# Patient Record
Sex: Female | Born: 1935
Health system: Southern US, Community
[De-identification: ages and names within clinical notes are randomized; demographics above are authoritative.]

---

## 2012-02-18 ENCOUNTER — Encounter: Payer: Self-pay | Admitting: Internal Medicine

## 2012-02-22 LAB — TSH: Thyroid Stimulating Horm: 6.54 u[IU]/mL — ABNORMAL HIGH

## 2012-02-23 ENCOUNTER — Encounter: Payer: Self-pay | Admitting: Internal Medicine

## 2012-03-25 ENCOUNTER — Encounter: Payer: Self-pay | Admitting: Internal Medicine

## 2012-04-24 ENCOUNTER — Encounter: Payer: Self-pay | Admitting: Internal Medicine

## 2012-05-19 LAB — URINALYSIS, COMPLETE
Bilirubin,UR: NEGATIVE
Nitrite: NEGATIVE
Ph: 6 (ref 4.5–8.0)
Protein: NEGATIVE
RBC,UR: 1 /HPF (ref 0–5)
Specific Gravity: 1.01 (ref 1.003–1.030)
Squamous Epithelial: 2

## 2012-05-20 LAB — URINE CULTURE

## 2012-05-25 ENCOUNTER — Encounter: Payer: Self-pay | Admitting: Internal Medicine

## 2012-05-25 LAB — BASIC METABOLIC PANEL
Anion Gap: 12 (ref 7–16)
Calcium, Total: 8.9 mg/dL (ref 8.5–10.1)
Chloride: 102 mmol/L (ref 98–107)
Co2: 26 mmol/L (ref 21–32)
EGFR (Non-African Amer.): >60
Osmolality: 278 (ref 275–301)

## 2012-06-25 ENCOUNTER — Encounter: Payer: Self-pay | Admitting: Internal Medicine

## 2012-07-25 ENCOUNTER — Encounter: Payer: Self-pay | Admitting: Internal Medicine

## 2012-08-25 ENCOUNTER — Encounter: Payer: Self-pay | Admitting: Internal Medicine

## 2012-09-24 ENCOUNTER — Encounter: Payer: Self-pay | Admitting: Internal Medicine

## 2012-10-02 ENCOUNTER — Ambulatory Visit: Payer: Self-pay | Admitting: Internal Medicine

## 2012-10-25 ENCOUNTER — Encounter: Payer: Self-pay | Admitting: Internal Medicine

## 2012-11-07 LAB — URINALYSIS, COMPLETE
Bilirubin,UR: NEGATIVE
Blood: NEGATIVE
Glucose,UR: NEGATIVE mg/dL (ref 0–75)
Hyaline Cast: 1
Ketone: NEGATIVE
Nitrite: NEGATIVE
Ph: 6 (ref 4.5–8.0)
RBC,UR: <1 /HPF (ref 0–5)
Specific Gravity: 1.008 (ref 1.003–1.030)
WBC UR: 6 /HPF (ref 0–5)

## 2012-11-07 LAB — BASIC METABOLIC PANEL
Anion Gap: 6 — ABNORMAL LOW (ref 7–16)
EGFR (African American): >60
EGFR (Non-African Amer.): >60
Sodium: 135 mmol/L — ABNORMAL LOW (ref 136–145)

## 2012-11-07 LAB — HEMOGLOBIN A1C: Hemoglobin A1C: 5.8 % (ref 4.2–6.3)

## 2012-11-07 LAB — TSH: Thyroid Stimulating Horm: 3.21 u[IU]/mL

## 2012-11-08 LAB — URINE CULTURE

## 2012-11-09 LAB — CBC WITH DIFFERENTIAL/PLATELET
Basophil %: 0.4 %
Eosinophil #: 0.6 10*3/uL (ref 0.0–0.7)
HCT: 34.6 % — ABNORMAL LOW (ref 35.0–47.0)
HGB: 11.4 g/dL — ABNORMAL LOW (ref 12.0–16.0)
Lymphocyte #: 1.3 10*3/uL (ref 1.0–3.6)
Lymphocyte %: 18.7 %
MCH: 29.2 pg (ref 26.0–34.0)
MCHC: 33 g/dL (ref 32.0–36.0)
MCV: 89 fL (ref 80–100)
Monocyte #: 0.6 x10 3/mm (ref 0.2–0.9)
Neutrophil %: 64.4 %
WBC: 7.2 10*3/uL (ref 3.6–11.0)

## 2012-11-25 ENCOUNTER — Encounter: Payer: Self-pay | Admitting: Internal Medicine

## 2012-12-04 LAB — URINALYSIS, COMPLETE
Blood: NEGATIVE
Glucose,UR: NEGATIVE mg/dL (ref 0–75)
Granular Cast: 4
Ketone: NEGATIVE
Nitrite: NEGATIVE
Squamous Epithelial: 3
WBC UR: 3 /HPF (ref 0–5)

## 2012-12-05 LAB — URINE CULTURE

## 2012-12-23 ENCOUNTER — Encounter: Payer: Self-pay | Admitting: Internal Medicine

## 2013-01-27 LAB — URINALYSIS, COMPLETE
Bacteria: NONE SEEN
Blood: NEGATIVE
Glucose,UR: NEGATIVE mg/dL (ref 0–75)
Nitrite: NEGATIVE
Ph: 6 (ref 4.5–8.0)
RBC,UR: 2 /HPF (ref 0–5)
Specific Gravity: 1.014 (ref 1.003–1.030)
Squamous Epithelial: NONE SEEN
WBC UR: 1 /HPF (ref 0–5)

## 2013-01-29 LAB — COMPREHENSIVE METABOLIC PANEL
Albumin: 3.6 g/dL (ref 3.4–5.0)
Alkaline Phosphatase: 166 U/L — ABNORMAL HIGH (ref 50–136)
Anion Gap: 7 (ref 7–16)
BUN: 9 mg/dL (ref 7–18)
Bilirubin,Total: 0.7 mg/dL (ref 0.2–1.0)
Calcium, Total: 9.1 mg/dL (ref 8.5–10.1)
Co2: 29 mmol/L (ref 21–32)
Creatinine: 0.53 mg/dL — ABNORMAL LOW (ref 0.60–1.30)
EGFR (Non-African Amer.): >60
Glucose: 125 mg/dL — ABNORMAL HIGH (ref 65–99)
Osmolality: 267 (ref 275–301)
SGPT (ALT): 13 U/L (ref 12–78)
Total Protein: 8.1 g/dL (ref 6.4–8.2)

## 2013-01-29 LAB — CBC WITH DIFFERENTIAL/PLATELET
Basophil %: 0.3 %
Eosinophil #: 0.3 10*3/uL (ref 0.0–0.7)
HGB: 13.2 g/dL (ref 12.0–16.0)
Lymphocyte #: 1.7 10*3/uL (ref 1.0–3.6)
Lymphocyte %: 15.5 %
MCH: 29.4 pg (ref 26.0–34.0)
Monocyte #: 0.8 x10 3/mm (ref 0.2–0.9)
Monocyte %: 7.3 %
Neutrophil #: 8.2 10*3/uL — ABNORMAL HIGH (ref 1.4–6.5)
Neutrophil %: 74 %
RBC: 4.49 10*6/uL (ref 3.80–5.20)
RDW: 16.2 % — ABNORMAL HIGH (ref 11.5–14.5)

## 2013-01-30 ENCOUNTER — Inpatient Hospital Stay: Payer: Self-pay | Admitting: Surgery

## 2013-01-30 DIAGNOSIS — Z01810 Encounter for preprocedural cardiovascular examination: Secondary | ICD-10-CM

## 2013-01-30 LAB — PROTIME-INR
INR: 1.2
Prothrombin Time: 15.5 secs — ABNORMAL HIGH (ref 11.5–14.7)

## 2013-01-30 LAB — APTT: Activated PTT: 40.4 secs — ABNORMAL HIGH (ref 23.6–35.9)

## 2013-01-31 LAB — CBC WITH DIFFERENTIAL/PLATELET
Basophil #: 0 10*3/uL (ref 0.0–0.1)
Eosinophil #: 0 10*3/uL (ref 0.0–0.7)
Eosinophil %: 0 %
Lymphocyte #: 0.8 10*3/uL — ABNORMAL LOW (ref 1.0–3.6)
Lymphocyte %: 5.7 %
MCH: 28.9 pg (ref 26.0–34.0)
MCHC: 32.6 g/dL (ref 32.0–36.0)
MCV: 89 fL (ref 80–100)
Neutrophil #: 12.3 10*3/uL — ABNORMAL HIGH (ref 1.4–6.5)
Platelet: 231 10*3/uL (ref 150–440)
RBC: 3.93 10*6/uL (ref 3.80–5.20)
WBC: 14.3 10*3/uL — ABNORMAL HIGH (ref 3.6–11.0)

## 2013-01-31 LAB — COMPREHENSIVE METABOLIC PANEL
Alkaline Phosphatase: 123 U/L (ref 50–136)
Anion Gap: 7 (ref 7–16)
BUN: 8 mg/dL (ref 7–18)
Bilirubin,Total: 0.6 mg/dL (ref 0.2–1.0)
Calcium, Total: 8.7 mg/dL (ref 8.5–10.1)
Creatinine: 0.8 mg/dL (ref 0.60–1.30)
Osmolality: 273 (ref 275–301)
SGOT(AST): 31 U/L (ref 15–37)
SGPT (ALT): 20 U/L (ref 12–78)
Sodium: 136 mmol/L (ref 136–145)
Total Protein: 7.3 g/dL (ref 6.4–8.2)

## 2013-02-01 LAB — COMPREHENSIVE METABOLIC PANEL
Albumin: 3 g/dL — ABNORMAL LOW (ref 3.4–5.0)
Anion Gap: 4 — ABNORMAL LOW (ref 7–16)
BUN: 7 mg/dL (ref 7–18)
Chloride: 105 mmol/L (ref 98–107)
Co2: 28 mmol/L (ref 21–32)
EGFR (African American): >60
EGFR (Non-African Amer.): >60
Glucose: 113 mg/dL — ABNORMAL HIGH (ref 65–99)
Osmolality: 273 (ref 275–301)
Potassium: 4.3 mmol/L (ref 3.5–5.1)
SGOT(AST): 17 U/L (ref 15–37)

## 2013-02-01 LAB — PATHOLOGY REPORT

## 2013-02-02 LAB — CBC WITH DIFFERENTIAL/PLATELET
Basophil #: 0.2 10*3/uL — ABNORMAL HIGH (ref 0.0–0.1)
Basophil %: 1.1 %
Eosinophil #: 0.4 10*3/uL (ref 0.0–0.7)
HCT: 33.1 % — ABNORMAL LOW (ref 35.0–47.0)
Lymphocyte #: 1.4 10*3/uL (ref 1.0–3.6)
MCH: 29.1 pg (ref 26.0–34.0)
MCHC: 32.4 g/dL (ref 32.0–36.0)
MCV: 90 fL (ref 80–100)
Monocyte #: 1.1 x10 3/mm — ABNORMAL HIGH (ref 0.2–0.9)
Monocyte %: 7.9 %
Neutrophil %: 78.6 %
WBC: 14.5 10*3/uL — ABNORMAL HIGH (ref 3.6–11.0)

## 2013-02-03 LAB — BASIC METABOLIC PANEL
Anion Gap: 6 — ABNORMAL LOW (ref 7–16)
BUN: 5 mg/dL — ABNORMAL LOW (ref 7–18)
Creatinine: 0.6 mg/dL (ref 0.60–1.30)
EGFR (Non-African Amer.): >60
Glucose: 99 mg/dL (ref 65–99)
Osmolality: 269 (ref 275–301)
Potassium: 3.6 mmol/L (ref 3.5–5.1)
Sodium: 136 mmol/L (ref 136–145)

## 2013-02-04 LAB — CBC WITH DIFFERENTIAL/PLATELET
Basophil #: 0.1 10*3/uL (ref 0.0–0.1)
Basophil %: 0.6 %
Eosinophil #: 0.6 10*3/uL (ref 0.0–0.7)
Eosinophil %: 5.6 %
HGB: 11.2 g/dL — ABNORMAL LOW (ref 12.0–16.0)
Lymphocyte %: 13.3 %
MCH: 28.8 pg (ref 26.0–34.0)
MCV: 89 fL (ref 80–100)
Monocyte #: 1.1 x10 3/mm — ABNORMAL HIGH (ref 0.2–0.9)
Monocyte %: 10 %
Neutrophil #: 7.7 10*3/uL — ABNORMAL HIGH (ref 1.4–6.5)
Neutrophil %: 70.5 %
Platelet: 329 10*3/uL (ref 150–440)
RBC: 3.9 10*6/uL (ref 3.80–5.20)
RDW: 16.2 % — ABNORMAL HIGH (ref 11.5–14.5)

## 2013-02-04 LAB — BASIC METABOLIC PANEL
Anion Gap: 4 — ABNORMAL LOW (ref 7–16)
Co2: 29 mmol/L (ref 21–32)
Creatinine: 0.65 mg/dL (ref 0.60–1.30)
EGFR (Non-African Amer.): >60
Glucose: 102 mg/dL — ABNORMAL HIGH (ref 65–99)
Osmolality: 267 (ref 275–301)
Potassium: 3.9 mmol/L (ref 3.5–5.1)
Sodium: 134 mmol/L — ABNORMAL LOW (ref 136–145)

## 2013-02-05 LAB — CBC WITH DIFFERENTIAL/PLATELET
Basophil %: 1.1 %
Eosinophil #: 0.6 10*3/uL (ref 0.0–0.7)
Eosinophil %: 5.1 %
HCT: 32.8 % — ABNORMAL LOW (ref 35.0–47.0)
Lymphocyte %: 13.8 %
MCH: 29.7 pg (ref 26.0–34.0)
MCHC: 33.3 g/dL (ref 32.0–36.0)
Monocyte #: 1.1 x10 3/mm — ABNORMAL HIGH (ref 0.2–0.9)
Monocyte %: 9.6 %
Neutrophil #: 8.3 10*3/uL — ABNORMAL HIGH (ref 1.4–6.5)
Platelet: 331 10*3/uL (ref 150–440)
WBC: 11.8 10*3/uL — ABNORMAL HIGH (ref 3.6–11.0)

## 2013-02-05 LAB — COMPREHENSIVE METABOLIC PANEL
Albumin: 2.6 g/dL — ABNORMAL LOW (ref 3.4–5.0)
Alkaline Phosphatase: 104 U/L (ref 50–136)
Anion Gap: 5 — ABNORMAL LOW (ref 7–16)
BUN: 10 mg/dL (ref 7–18)
Calcium, Total: 9.4 mg/dL (ref 8.5–10.1)
Chloride: 102 mmol/L (ref 98–107)
Co2: 28 mmol/L (ref 21–32)
Creatinine: 0.72 mg/dL (ref 0.60–1.30)
EGFR (African American): >60
Osmolality: 269 (ref 275–301)
Potassium: 3.6 mmol/L (ref 3.5–5.1)
Sodium: 135 mmol/L — ABNORMAL LOW (ref 136–145)
Total Protein: 7.1 g/dL (ref 6.4–8.2)

## 2013-02-06 LAB — HEPATIC FUNCTION PANEL A (ARMC)
Bilirubin, Direct: 0.1 mg/dL (ref 0.00–0.20)
Bilirubin,Total: 0.3 mg/dL (ref 0.2–1.0)
Total Protein: 7.2 g/dL (ref 6.4–8.2)

## 2013-02-08 ENCOUNTER — Encounter: Payer: Self-pay | Admitting: Internal Medicine

## 2013-02-22 ENCOUNTER — Encounter: Payer: Self-pay | Admitting: Internal Medicine

## 2013-03-03 ENCOUNTER — Inpatient Hospital Stay: Payer: Self-pay | Admitting: Internal Medicine

## 2013-03-03 LAB — COMPREHENSIVE METABOLIC PANEL
Alkaline Phosphatase: 281 U/L — ABNORMAL HIGH (ref 50–136)
Anion Gap: 5 — ABNORMAL LOW (ref 7–16)
BUN: 16 mg/dL (ref 7–18)
Chloride: 102 mmol/L (ref 98–107)
Co2: 29 mmol/L (ref 21–32)
Creatinine: 1.05 mg/dL (ref 0.60–1.30)
Glucose: 112 mg/dL — ABNORMAL HIGH (ref 65–99)
Osmolality: 274 (ref 275–301)
Potassium: 4.5 mmol/L (ref 3.5–5.1)
SGPT (ALT): 61 U/L (ref 12–78)
Sodium: 136 mmol/L (ref 136–145)
Total Protein: 8.4 g/dL — ABNORMAL HIGH (ref 6.4–8.2)

## 2013-03-03 LAB — DRUG SCREEN, URINE
Barbiturates, Ur Screen: NEGATIVE (ref ?–200)
Benzodiazepine, Ur Scrn: NEGATIVE (ref ?–200)
Cannabinoid 50 Ng, Ur ~~LOC~~: NEGATIVE (ref ?–50)
Phencyclidine (PCP) Ur S: NEGATIVE (ref ?–25)
Tricyclic, Ur Screen: NEGATIVE (ref ?–1000)

## 2013-03-03 LAB — CBC
HCT: 37.9 % (ref 35.0–47.0)
HGB: 12.3 g/dL (ref 12.0–16.0)
Platelet: 274 10*3/uL (ref 150–440)
RBC: 4.32 10*6/uL (ref 3.80–5.20)
WBC: 12.7 10*3/uL — ABNORMAL HIGH (ref 3.6–11.0)

## 2013-03-03 LAB — URINALYSIS, COMPLETE
Bilirubin,UR: NEGATIVE
Blood: NEGATIVE
Ketone: NEGATIVE
Ph: 5 (ref 4.5–8.0)
RBC,UR: 5 /HPF (ref 0–5)
Specific Gravity: 1.013 (ref 1.003–1.030)

## 2013-03-04 LAB — CBC WITH DIFFERENTIAL/PLATELET
Basophil #: 0 10*3/uL (ref 0.0–0.1)
Eosinophil #: 0.2 10*3/uL (ref 0.0–0.7)
HCT: 35.1 % (ref 35.0–47.0)
Lymphocyte %: 10.2 %
MCV: 88 fL (ref 80–100)
Monocyte #: 1.1 x10 3/mm — ABNORMAL HIGH (ref 0.2–0.9)
Monocyte %: 9 %
Neutrophil #: 9.9 10*3/uL — ABNORMAL HIGH (ref 1.4–6.5)
Neutrophil %: 78.7 %
Platelet: 272 10*3/uL (ref 150–440)
RDW: 16.1 % — ABNORMAL HIGH (ref 11.5–14.5)
WBC: 12.6 10*3/uL — ABNORMAL HIGH (ref 3.6–11.0)

## 2013-03-04 LAB — BASIC METABOLIC PANEL
Anion Gap: 3 — ABNORMAL LOW (ref 7–16)
Co2: 32 mmol/L (ref 21–32)
Creatinine: 0.81 mg/dL (ref 0.60–1.30)
EGFR (African American): >60
Potassium: 4.4 mmol/L (ref 3.5–5.1)
Sodium: 138 mmol/L (ref 136–145)

## 2013-03-05 LAB — COMPREHENSIVE METABOLIC PANEL
Albumin: 2.7 g/dL — ABNORMAL LOW (ref 3.4–5.0)
BUN: 12 mg/dL (ref 7–18)
Bilirubin,Total: 0.8 mg/dL (ref 0.2–1.0)
Chloride: 100 mmol/L (ref 98–107)
Co2: 32 mmol/L (ref 21–32)
Creatinine: 0.76 mg/dL (ref 0.60–1.30)
EGFR (African American): >60
Glucose: 84 mg/dL (ref 65–99)
Osmolality: 267 (ref 275–301)
SGOT(AST): 44 U/L — ABNORMAL HIGH (ref 15–37)

## 2013-03-05 LAB — CBC WITH DIFFERENTIAL/PLATELET
Basophil %: 0.2 %
Eosinophil #: 0.4 10*3/uL (ref 0.0–0.7)
HCT: 31.9 % — ABNORMAL LOW (ref 35.0–47.0)
HGB: 10.9 g/dL — ABNORMAL LOW (ref 12.0–16.0)
Lymphocyte #: 1.8 10*3/uL (ref 1.0–3.6)
MCH: 29.9 pg (ref 26.0–34.0)
MCHC: 34.2 g/dL (ref 32.0–36.0)
Monocyte #: 1 x10 3/mm — ABNORMAL HIGH (ref 0.2–0.9)
Neutrophil #: 4.8 10*3/uL (ref 1.4–6.5)
Neutrophil %: 60.5 %
RBC: 3.65 10*6/uL — ABNORMAL LOW (ref 3.80–5.20)
RDW: 15.8 % — ABNORMAL HIGH (ref 11.5–14.5)
WBC: 8 10*3/uL (ref 3.6–11.0)

## 2013-03-11 LAB — URINALYSIS, COMPLETE
Bilirubin,UR: NEGATIVE
Blood: NEGATIVE
Glucose,UR: NEGATIVE mg/dL (ref 0–75)
Ketone: NEGATIVE
Nitrite: NEGATIVE
Ph: 6 (ref 4.5–8.0)
Protein: NEGATIVE
RBC,UR: 7 /HPF (ref 0–5)

## 2013-03-13 LAB — URINE CULTURE

## 2013-03-25 ENCOUNTER — Encounter: Payer: Self-pay | Admitting: Internal Medicine

## 2013-04-19 LAB — URINALYSIS, COMPLETE
Bacteria: NONE SEEN
Bilirubin,UR: NEGATIVE
Blood: NEGATIVE
Ketone: NEGATIVE
Ph: 5 (ref 4.5–8.0)
Protein: NEGATIVE
RBC,UR: 2 /HPF (ref 0–5)
Specific Gravity: 1.016 (ref 1.003–1.030)
Squamous Epithelial: 3
WBC UR: 11 /HPF (ref 0–5)

## 2013-04-20 LAB — URINE CULTURE

## 2013-04-24 ENCOUNTER — Encounter: Payer: Self-pay | Admitting: Internal Medicine

## 2013-04-26 LAB — COMPREHENSIVE METABOLIC PANEL
Albumin: 3.5 g/dL (ref 3.4–5.0)
Alkaline Phosphatase: 183 U/L — ABNORMAL HIGH (ref 50–136)
Anion Gap: 8 (ref 7–16)
Bilirubin,Total: 0.3 mg/dL (ref 0.2–1.0)
Calcium, Total: 9.9 mg/dL (ref 8.5–10.1)
Chloride: 102 mmol/L (ref 98–107)
Co2: 29 mmol/L (ref 21–32)
Creatinine: 0.73 mg/dL (ref 0.60–1.30)
EGFR (African American): >60
Glucose: 91 mg/dL (ref 65–99)
Osmolality: 278 (ref 275–301)
SGOT(AST): 14 U/L — ABNORMAL LOW (ref 15–37)
SGPT (ALT): 15 U/L (ref 12–78)
Total Protein: 8.2 g/dL (ref 6.4–8.2)

## 2013-04-26 LAB — TSH: Thyroid Stimulating Horm: 2.1 u[IU]/mL

## 2013-04-26 LAB — CBC WITH DIFFERENTIAL/PLATELET
Basophil %: 0.4 %
Eosinophil %: 3.4 %
HCT: 39 % (ref 35.0–47.0)
HGB: 13.2 g/dL (ref 12.0–16.0)
Lymphocyte %: 26 %
MCH: 29.9 pg (ref 26.0–34.0)
Monocyte %: 8.4 %
Neutrophil %: 61.8 %
Platelet: 258 10*3/uL (ref 150–440)
RBC: 4.42 10*6/uL (ref 3.80–5.20)
RDW: 16.6 % — ABNORMAL HIGH (ref 11.5–14.5)

## 2013-05-25 ENCOUNTER — Encounter: Payer: Self-pay | Admitting: Internal Medicine

## 2013-06-25 ENCOUNTER — Encounter: Payer: Self-pay | Admitting: Internal Medicine

## 2013-07-25 ENCOUNTER — Encounter: Payer: Self-pay | Admitting: Internal Medicine

## 2013-08-25 ENCOUNTER — Encounter: Payer: Self-pay | Admitting: Internal Medicine

## 2013-09-19 LAB — URINALYSIS, COMPLETE
Bilirubin,UR: NEGATIVE
Glucose,UR: NEGATIVE mg/dL (ref 0–75)
Ketone: NEGATIVE
Leukocyte Esterase: NEGATIVE
Nitrite: NEGATIVE
Ph: 5 (ref 4.5–8.0)
Protein: NEGATIVE
Specific Gravity: 1.013 (ref 1.003–1.030)
Squamous Epithelial: 2
WBC UR: 3 /HPF (ref 0–5)

## 2013-09-21 LAB — URINE CULTURE

## 2013-09-24 ENCOUNTER — Encounter: Payer: Self-pay | Admitting: Internal Medicine

## 2013-09-29 IMAGING — CT CT HEAD WITHOUT CONTRAST
1 of 2 series · 13 of 30 positions shown, 17 images · non-contrast
Comparison: none

REASON FOR EXAM: altered mental status
COMMENTS:   May transport without cardiac monitor

PROCEDURE:     CT  - CT HEAD WITHOUT CONTRAST  - March 03, 2013  [DATE]
RESULT:     Technique: Helical noncontrasted 5 mm sections were obtained
from the skull base through the vertex.

[Series 4: soft tissue recon · axial · 0.42mm/px · z∈[-150,-21]mm · 13 of 32 slices shown, 17 images]
[im 3/32  brain]
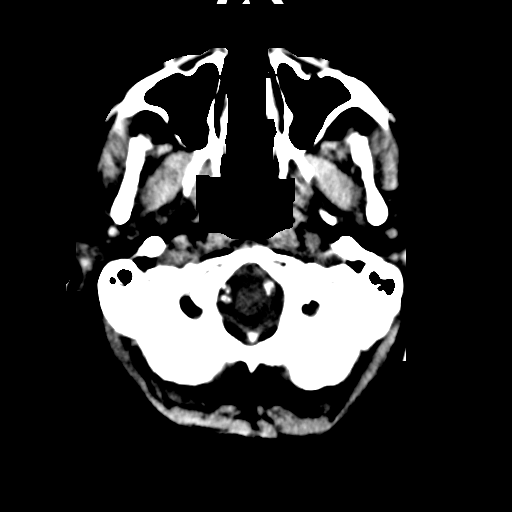
[im 3/32  bone]
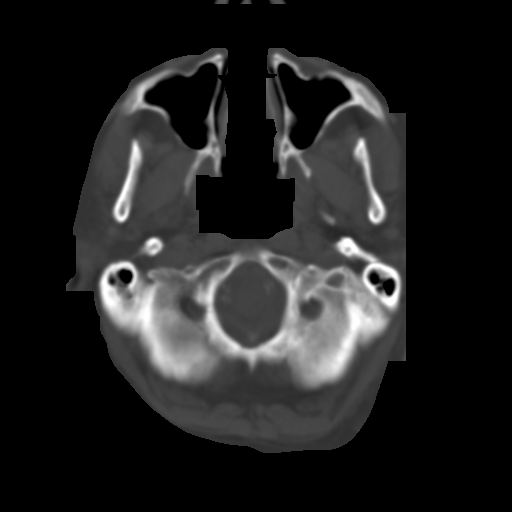
[im 5/32  brain]
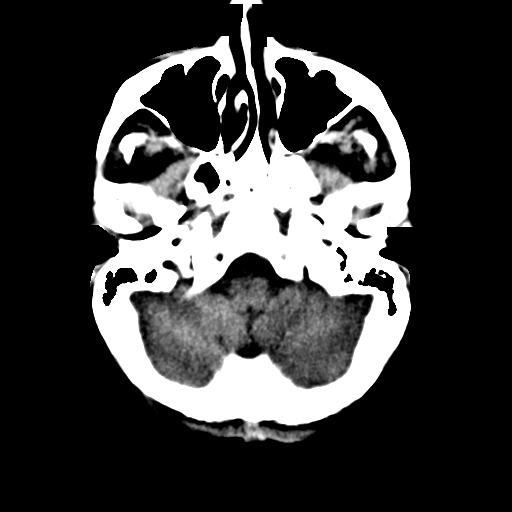
[im 7/32  brain]
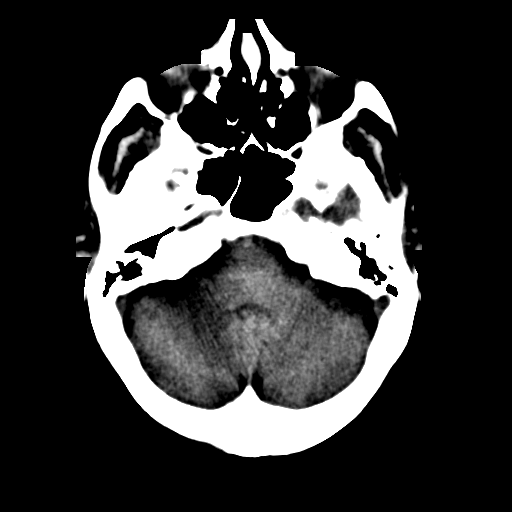
[im 9/32  brain]
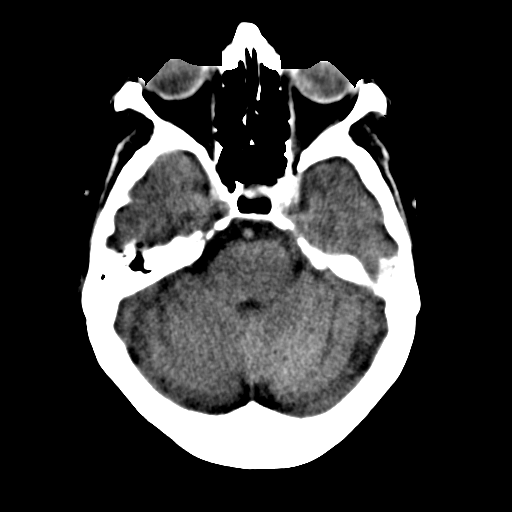
[im 12/32  brain]
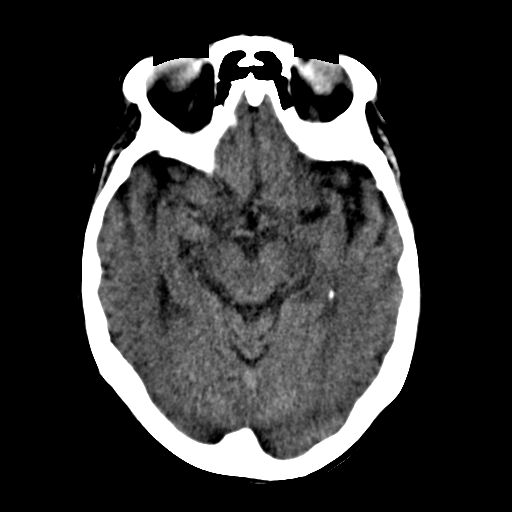
[im 12/32  bone]
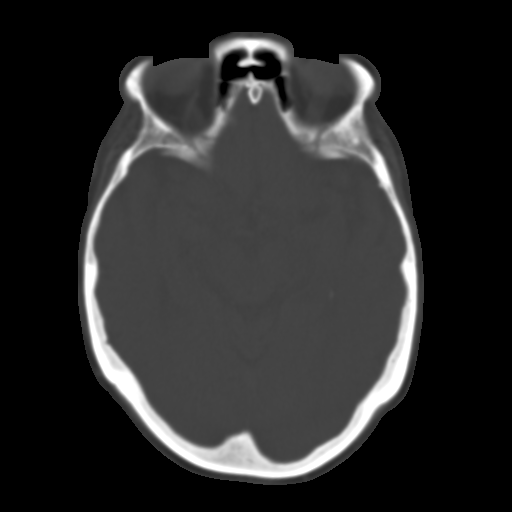
[im 14/32  brain]
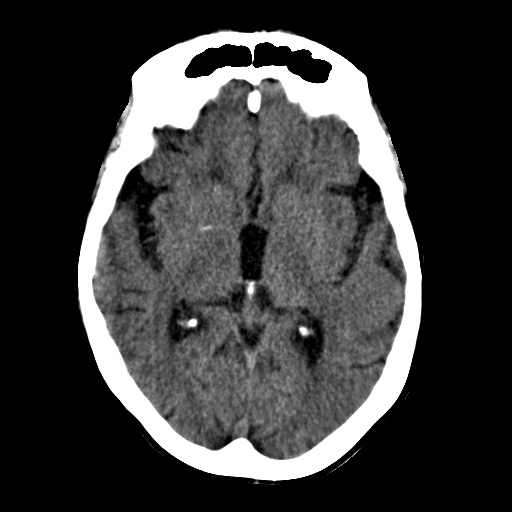
[im 16/32  brain]
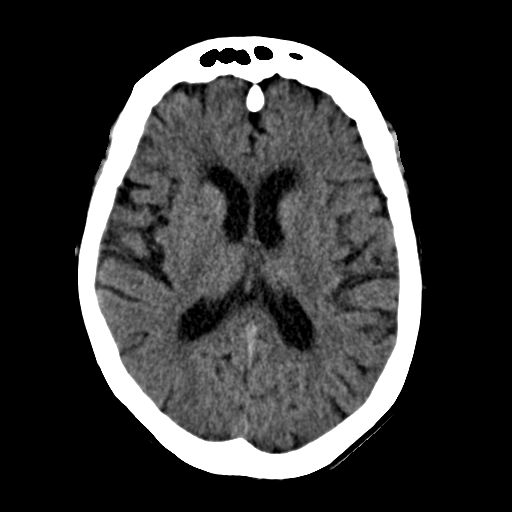
[im 18/32  brain]
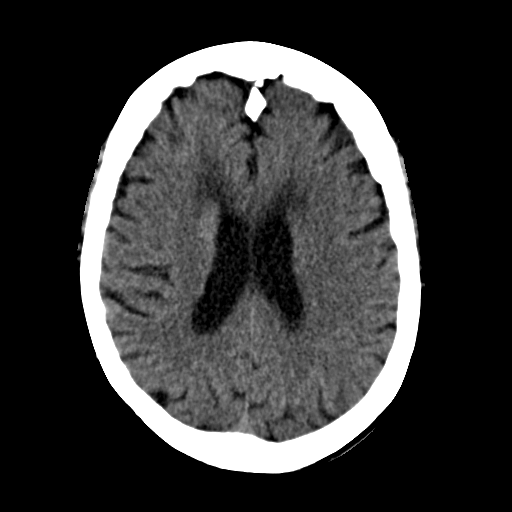
[im 20/32  brain]
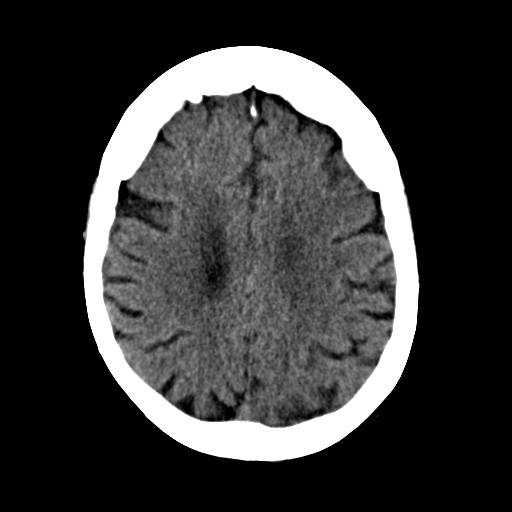
[im 20/32  bone]
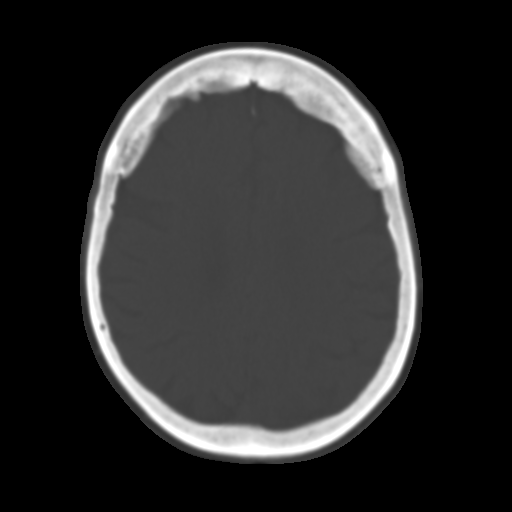
[im 23/32  brain]
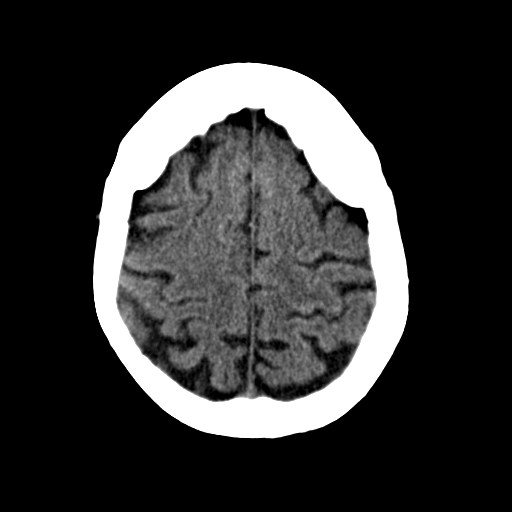
[im 25/32  brain]
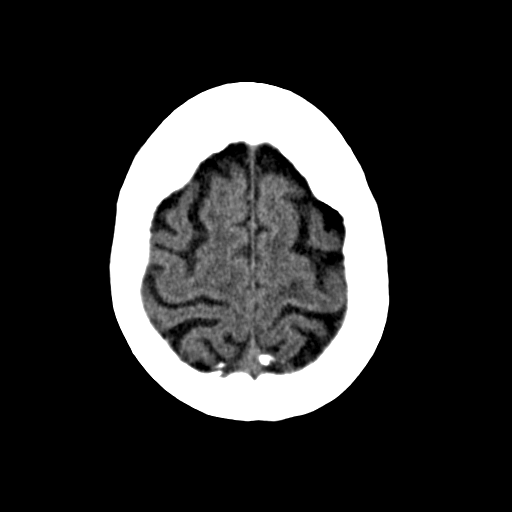
[im 27/32  brain]
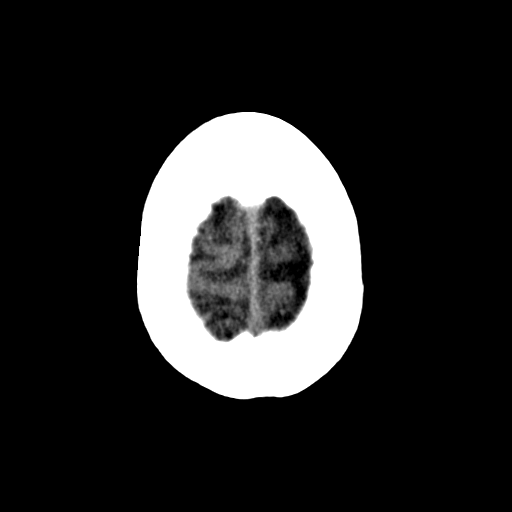
[im 29/32  brain]
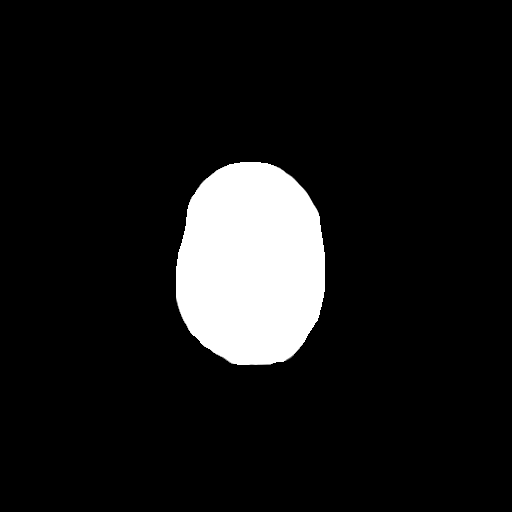
[im 29/32  bone]
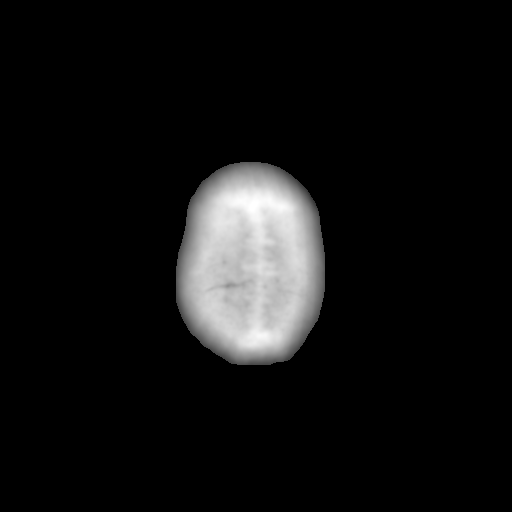

[13 of 30 positions shown; findings below may reference images not displayed]

FINDINGS: Diffuse cortical and cerebellar atrophy is identified as well as
diffuse areas of low attenuation within the subcortical, deep and
periventricular white matter regions. There is not evidence of intra-axial
nor extra-axial fluid collections, acute hemorrhage, mass effect, nor a
depressed skull fracture. The visualized paranasal sinuses and mastoid air
cells are patent.
IMPRESSION: Chronic and involutional changes without evidence of acute
abnormalities. If there is persistent clinical concern further evaluation
with MRI is recommended.
2. Dr. Baadjies of the emergency department was informed of these findings
via a preliminary faxed report.

## 2013-10-15 ENCOUNTER — Emergency Department: Payer: Self-pay | Admitting: Emergency Medicine

## 2013-10-25 ENCOUNTER — Ambulatory Visit: Payer: Self-pay | Admitting: Nurse Practitioner

## 2013-10-25 ENCOUNTER — Encounter: Payer: Self-pay | Admitting: Internal Medicine

## 2013-10-27 LAB — URINALYSIS, COMPLETE
BLOOD: NEGATIVE
Bilirubin,UR: NEGATIVE
Glucose,UR: NEGATIVE mg/dL (ref 0–75)
KETONE: NEGATIVE
NITRITE: NEGATIVE
PH: 5 (ref 4.5–8.0)
PROTEIN: NEGATIVE
RBC,UR: <1 /HPF (ref 0–5)
Specific Gravity: 1.023 (ref 1.003–1.030)
Squamous Epithelial: 4
WBC UR: 3 /HPF (ref 0–5)

## 2013-10-28 LAB — URINE CULTURE

## 2013-11-08 LAB — HEMOGLOBIN A1C: Hemoglobin A1C: 6.2 % (ref 4.2–6.3)

## 2013-11-08 LAB — LIPID PANEL
CHOLESTEROL: 198 mg/dL (ref 0–200)
HDL: 29 mg/dL — AB (ref 40–60)
Ldl Cholesterol, Calc: 139 mg/dL — ABNORMAL HIGH (ref 0–100)
Triglycerides: 151 mg/dL (ref 0–200)
VLDL Cholesterol, Calc: 30 mg/dL (ref 5–40)

## 2013-11-13 LAB — BASIC METABOLIC PANEL
Anion Gap: 7 (ref 7–16)
BUN: 9 mg/dL (ref 7–18)
CALCIUM: 9.7 mg/dL (ref 8.5–10.1)
CO2: 30 mmol/L (ref 21–32)
CREATININE: 0.76 mg/dL (ref 0.60–1.30)
Chloride: 103 mmol/L (ref 98–107)
Glucose: 107 mg/dL — ABNORMAL HIGH (ref 65–99)
Osmolality: 279 (ref 275–301)
Potassium: 3.9 mmol/L (ref 3.5–5.1)
Sodium: 140 mmol/L (ref 136–145)

## 2013-11-13 LAB — TSH: Thyroid Stimulating Horm: 1.5 u[IU]/mL

## 2013-11-13 LAB — CBC WITH DIFFERENTIAL/PLATELET
Basophil #: 0 10*3/uL (ref 0.0–0.1)
Basophil %: 0.5 %
EOS PCT: 5 %
Eosinophil #: 0.4 10*3/uL (ref 0.0–0.7)
HCT: 42.8 % (ref 35.0–47.0)
HGB: 14.1 g/dL (ref 12.0–16.0)
LYMPHS PCT: 28.8 %
Lymphocyte #: 2.3 10*3/uL (ref 1.0–3.6)
MCH: 30.8 pg (ref 26.0–34.0)
MCHC: 33.1 g/dL (ref 32.0–36.0)
MCV: 93 fL (ref 80–100)
MONOS PCT: 7.6 %
Monocyte #: 0.6 x10 3/mm (ref 0.2–0.9)
Neutrophil #: 4.7 10*3/uL (ref 1.4–6.5)
Neutrophil %: 58.1 %
PLATELETS: 216 10*3/uL (ref 150–440)
RBC: 4.6 10*6/uL (ref 3.80–5.20)
RDW: 15 % — AB (ref 11.5–14.5)
WBC: 8.1 10*3/uL (ref 3.6–11.0)

## 2013-11-13 LAB — FOLATE: FOLIC ACID: 9 ng/mL (ref 3.1–100.0)

## 2013-11-14 LAB — URINALYSIS, COMPLETE
BILIRUBIN, UR: NEGATIVE
Blood: NEGATIVE
GLUCOSE, UR: NEGATIVE mg/dL (ref 0–75)
Ketone: NEGATIVE
Nitrite: NEGATIVE
PH: 5 (ref 4.5–8.0)
PROTEIN: NEGATIVE
RBC,UR: NONE SEEN /HPF (ref 0–5)
SPECIFIC GRAVITY: 1.006 (ref 1.003–1.030)
Squamous Epithelial: 4
WBC UR: 6 /HPF (ref 0–5)

## 2013-11-15 LAB — RAPID INFLUENZA A&B ANTIGENS (ARMC ONLY)

## 2013-11-16 LAB — URINE CULTURE

## 2013-11-25 ENCOUNTER — Encounter: Payer: Self-pay | Admitting: Internal Medicine

## 2013-12-23 ENCOUNTER — Other Ambulatory Visit: Payer: Self-pay | Admitting: Internal Medicine

## 2013-12-23 LAB — URINALYSIS, COMPLETE
BACTERIA: NONE SEEN
BLOOD: NEGATIVE
Bilirubin,UR: NEGATIVE
Glucose,UR: NEGATIVE mg/dL (ref 0–75)
KETONE: NEGATIVE
LEUKOCYTE ESTERASE: NEGATIVE
NITRITE: NEGATIVE
PH: 5 (ref 4.5–8.0)
PROTEIN: NEGATIVE
Specific Gravity: 1.026 (ref 1.003–1.030)
Squamous Epithelial: 5
WBC UR: 4 /HPF (ref 0–5)

## 2013-12-24 LAB — URINE CULTURE

## 2013-12-31 ENCOUNTER — Ambulatory Visit: Payer: Self-pay | Admitting: Gerontology

## 2013-12-31 ENCOUNTER — Encounter: Payer: Self-pay | Admitting: Internal Medicine

## 2013-12-31 LAB — CBC WITH DIFFERENTIAL/PLATELET
BASOS PCT: 0.2 %
Basophil #: 0 10*3/uL (ref 0.0–0.1)
EOS PCT: 0.1 %
Eosinophil #: 0 10*3/uL (ref 0.0–0.7)
HCT: 43.4 % (ref 35.0–47.0)
HGB: 14.7 g/dL (ref 12.0–16.0)
LYMPHS ABS: 0.7 10*3/uL — AB (ref 1.0–3.6)
Lymphocyte %: 3.5 %
MCH: 32.1 pg (ref 26.0–34.0)
MCHC: 33.9 g/dL (ref 32.0–36.0)
MCV: 95 fL (ref 80–100)
Monocyte #: 1.3 x10 3/mm — ABNORMAL HIGH (ref 0.2–0.9)
Monocyte %: 7.1 %
Neutrophil #: 16.8 10*3/uL — ABNORMAL HIGH (ref 1.4–6.5)
Neutrophil %: 89.1 %
Platelet: 223 10*3/uL (ref 150–440)
RBC: 4.59 10*6/uL (ref 3.80–5.20)
RDW: 15.5 % — AB (ref 11.5–14.5)
WBC: 18.8 10*3/uL — AB (ref 3.6–11.0)

## 2013-12-31 LAB — URINALYSIS, COMPLETE
Bacteria: NONE SEEN
Bilirubin,UR: NEGATIVE
Blood: NEGATIVE
Glucose,UR: NEGATIVE mg/dL (ref 0–75)
Ketone: NEGATIVE
Leukocyte Esterase: NEGATIVE
Nitrite: NEGATIVE
Ph: 6 (ref 4.5–8.0)
Protein: NEGATIVE
RBC,UR: 2 /HPF (ref 0–5)
Specific Gravity: 1.015 (ref 1.003–1.030)
Squamous Epithelial: 1

## 2013-12-31 LAB — BASIC METABOLIC PANEL
ANION GAP: 6 — AB (ref 7–16)
BUN: 5 mg/dL — ABNORMAL LOW (ref 7–18)
CALCIUM: 9.1 mg/dL (ref 8.5–10.1)
CO2: 28 mmol/L (ref 21–32)
CREATININE: 0.7 mg/dL (ref 0.60–1.30)
Chloride: 103 mmol/L (ref 98–107)
EGFR (Non-African Amer.): >60
Glucose: 177 mg/dL — ABNORMAL HIGH (ref 65–99)
Osmolality: 275 (ref 275–301)
POTASSIUM: 4.3 mmol/L (ref 3.5–5.1)
Sodium: 137 mmol/L (ref 136–145)

## 2014-01-01 LAB — URINE CULTURE

## 2014-01-23 ENCOUNTER — Encounter: Payer: Self-pay | Admitting: Internal Medicine

## 2014-01-23 ENCOUNTER — Ambulatory Visit: Payer: Self-pay | Admitting: Nurse Practitioner

## 2014-01-28 ENCOUNTER — Emergency Department: Payer: Self-pay | Admitting: Emergency Medicine

## 2014-01-28 LAB — CBC WITH DIFFERENTIAL/PLATELET
Basophil #: 0.1 10*3/uL (ref 0.0–0.1)
Basophil %: 0.6 %
Eosinophil #: 0.7 10*3/uL (ref 0.0–0.7)
Eosinophil %: 3.9 %
HCT: 44.8 % (ref 35.0–47.0)
HGB: 14.7 g/dL (ref 12.0–16.0)
LYMPHS ABS: 3 10*3/uL (ref 1.0–3.6)
LYMPHS PCT: 17 %
MCH: 31.6 pg (ref 26.0–34.0)
MCHC: 32.8 g/dL (ref 32.0–36.0)
MCV: 96 fL (ref 80–100)
Monocyte #: 1.4 x10 3/mm — ABNORMAL HIGH (ref 0.2–0.9)
Monocyte %: 7.9 %
Neutrophil #: 12.3 10*3/uL — ABNORMAL HIGH (ref 1.4–6.5)
Neutrophil %: 70.6 %
PLATELETS: 219 10*3/uL (ref 150–440)
RBC: 4.66 10*6/uL (ref 3.80–5.20)
RDW: 15.6 % — AB (ref 11.5–14.5)
WBC: 17.5 10*3/uL — AB (ref 3.6–11.0)

## 2014-01-28 LAB — COMPREHENSIVE METABOLIC PANEL
ALBUMIN: 3.1 g/dL — AB (ref 3.4–5.0)
ALT: 16 U/L (ref 12–78)
Alkaline Phosphatase: 183 U/L — ABNORMAL HIGH
Anion Gap: 6 — ABNORMAL LOW (ref 7–16)
BILIRUBIN TOTAL: 0.7 mg/dL (ref 0.2–1.0)
BUN: 6 mg/dL — ABNORMAL LOW (ref 7–18)
CALCIUM: 8.7 mg/dL (ref 8.5–10.1)
Chloride: 103 mmol/L (ref 98–107)
Co2: 29 mmol/L (ref 21–32)
Creatinine: 0.71 mg/dL (ref 0.60–1.30)
EGFR (Non-African Amer.): >60
Glucose: 118 mg/dL — ABNORMAL HIGH (ref 65–99)
Osmolality: 274 (ref 275–301)
POTASSIUM: 3.9 mmol/L (ref 3.5–5.1)
SGOT(AST): 28 U/L (ref 15–37)
SODIUM: 138 mmol/L (ref 136–145)
Total Protein: 7.4 g/dL (ref 6.4–8.2)

## 2014-02-02 LAB — CULTURE, BLOOD (SINGLE)

## 2014-02-21 LAB — URINALYSIS, COMPLETE
BACTERIA: NONE SEEN
BLOOD: NEGATIVE
Bilirubin,UR: NEGATIVE
GLUCOSE, UR: NEGATIVE mg/dL (ref 0–75)
KETONE: NEGATIVE
Nitrite: NEGATIVE
Ph: 5 (ref 4.5–8.0)
Protein: NEGATIVE
Specific Gravity: 1.017 (ref 1.003–1.030)
WBC UR: 7 /HPF (ref 0–5)

## 2014-02-22 ENCOUNTER — Encounter: Payer: Self-pay | Admitting: Internal Medicine

## 2014-02-22 ENCOUNTER — Ambulatory Visit: Payer: Self-pay | Admitting: Nurse Practitioner

## 2014-02-23 LAB — URINE CULTURE

## 2014-03-25 ENCOUNTER — Encounter: Payer: Self-pay | Admitting: Internal Medicine

## 2014-04-24 ENCOUNTER — Encounter: Payer: Self-pay | Admitting: Internal Medicine

## 2014-05-25 ENCOUNTER — Encounter: Payer: Self-pay | Admitting: Internal Medicine

## 2014-06-25 ENCOUNTER — Encounter: Payer: Self-pay | Admitting: Internal Medicine

## 2014-07-25 ENCOUNTER — Encounter: Payer: Self-pay | Admitting: Internal Medicine

## 2014-08-13 LAB — BASIC METABOLIC PANEL
ANION GAP: 7 (ref 7–16)
BUN: 11 mg/dL (ref 7–18)
Calcium, Total: 8.9 mg/dL (ref 8.5–10.1)
Chloride: 106 mmol/L (ref 98–107)
Co2: 28 mmol/L (ref 21–32)
Creatinine: 0.78 mg/dL (ref 0.60–1.30)
EGFR (African American): >60
EGFR (Non-African Amer.): >60
Glucose: 83 mg/dL (ref 65–99)
Osmolality: 280 (ref 275–301)
Potassium: 3.8 mmol/L (ref 3.5–5.1)
Sodium: 141 mmol/L (ref 136–145)

## 2014-08-25 ENCOUNTER — Encounter: Payer: Self-pay | Admitting: Internal Medicine

## 2014-09-24 ENCOUNTER — Encounter: Payer: Self-pay | Admitting: Internal Medicine

## 2014-10-25 ENCOUNTER — Encounter: Payer: Self-pay | Admitting: Internal Medicine

## 2014-10-25 DEATH — deceased

## 2015-02-14 NOTE — Consult Note (Signed)
PATIENT NAME:  [name], [name] MR#:  [id number] DATE OF BIRTH:  Jun 15, 1935  DATE OF CONSULTATION:  02/02/2013  REFERRING PHYSICIAN:  Cristal Deer A. Lundquist, MD CONSULTING PHYSICIAN:  Aryanne Gilleland P. Juliene Pina, MD PRIMARY CARE PHYSICIAN:  Marya Amsler. Dareen Piano, MD  REASON FOR CONSULTATION: Shortness of breath.   IMPRESSION:  1.  Shortness of breath, likely multifactorial. Chest x-ray shows possible atelectasis, cannot rule out pneumonia. She has on lung exam some crackles and definite wheezing, without a history of congestive heart failure or obstructive lung disease.  2.  History of atrial fibrillation.  3.  History of mild dementia with some confusion.  4.  History of hypertension. 5.  Hypothyroidism.  6.  Tachycardia.  PLAN:  1.  For her shortness of breath, I agree with increase Lasix. We will hold her p.o. dose. I also will order an echocardiogram to evaluate for congestive heart failure. She has a low-grade fever and a slightly elevated white blood cell count. She is currently on Unasyn. She may have a pneumonia per the chest x-ray and her clinical findings, so I recommend changing her to Zosyn which will also cover abdominal pathology, which she should continue with incentive spirometer as already placed by her bedside, and I would stop IV fluids for now.  2.  I also agree with holding narcotics due to her increased confusion.  3.  O2 if needed, but currently not hypoxic.  4.  I would continue all her other medications.  5.  As far as her tachycardia is concerned, she is currently on Cardizem. I will change this for better heart rate control to 60 mg t.i.d. Her blood pressure looks like it will be able to tolerate that.   HISTORY OF PRESENT ILLNESS: This is a 80 year old female who was admitted on April 8 for acute cholecystitis. She underwent a cholecystectomy on April 8 and the hospitalist was consulted for shortness of breath. On my evaluation of the patient, she does seem confused. Her sister is  at bedside, who helps with some of the information. It seems that patient does have some mild dementia at baseline, but she is more confused than usual. She is not requiring any oxygen at this time. However, she is complaining of feeling like she is short of breath and she has obvious congestion and wheezing.   REVIEW OF SYSTEMS:    CONSTITUTIONAL: She has a low-grade fever here. Other review of systems actually I am not able to obtain, as the patient is confused and does not answer appropriately.   PAST MEDICAL HISTORY: According to the medical chart:  1.  History of atrial fibrillation.  2.  Hypertension.  3. Dementia.  4.  Hypothyroidism.  5.  Depression.   MEDICATIONS:  1.  Xanax 0.25 mg q.8 hours p.r.n.  2.  Celexa 20 mg daily. 3.  Diltiazem 30 mg t.i.d.  4.  Lasix 40 mg daily. 5.  Gabapentin 300 t.i.d.  6.  Synthroid 137 mcg daily. 7.  KCl 40 mEq daily.  8.  Colace 100 mg b.i.d.  9.  The patient is currently on Unasyn.   SOCIAL HISTORY: The patient is a resident at [facility] nursing home. No recorded tobacco or alcohol use.   FAMILY HISTORY: Unknown.   PAST SURGICAL HISTORY: She underwent a laparoscopic cholecystectomy on 01/30/2013 and she has had a C-section.   ALLERGIES: No known drug allergies.   PHYSICAL EXAMINATION:  VITAL SIGNS: Temperature 99.5, pulse 112, respirations 18 to 22, blood pressure 122 to 135  over 53 to 75, 93% on room air.  GENERAL: The patient is alert, oriented to her name, not place or time.  HEENT: Head is atraumatic. Pupils are round and reactive. Sclerae anicteric. Mucous membranes are moist. Oropharynx is clear.  NECK: Supple. No appreciable JVD or carotid bruit.  CARDIOVASCULAR: Irregularly irregular with a II/VI systolic murmur heard best at the right sternal border. There is no radiation. PMI is hard to palpate.  LUNGS: She has crackles at the bases along with wheezing bilaterally. No rhonchi or rales.  BACK: No CVA or vertebral tenderness.   ABDOMEN: Bowel sounds are positive. Nontender. She does have a JP drain in place.  EXTREMITIES: She has SCDs.  NEUROLOGIC: Cranial nerves II through XII are grossly intact.  MUSCULOSKELETAL: She is able to move all extremities.  SKIN: Without rashes or lesions.   LABORATORY, DIAGNOSTIC AND RADIOLOGICAL DATA: White blood cells 14.5, hemoglobin 10.7, hematocrit 33.1, platelets 254. Sodium 137, potassium 4.3, chloride 105, bicarbonate 28, BUN 7, creatinine 0.71, glucose 113, calcium 9.5, bilirubin 0.4, alkaline phosphatase 115, ALT 16, AST 17, total protein 7.8, albumin 3.0. Chest x-ray from April 10 shows poor inspiration with atelectasis in the lung bases; superimposed pneumonia cannot be related. EKG on admission did show atrial fibrillation.   Thank you for allowing us to participate in the care of this patient. We will continue to follow. Plan of care will be discussed with Dr. Juliann PulseLundquist.  ____________________________ Janyth ContesSital P. Juliene PinaMody, MD spm:jm D: 02/02/2013 14:21:43 ET T: 02/02/2013 16:08:04 ET JOB#: [id number]  cc: Detria Cummings P. Juliene PinaMody, MD, <Dictator> Janyth ContesSITAL P Fread Kottke MD ELECTRONICALLY SIGNED 02/02/2013 20:38

## 2015-02-14 NOTE — Consult Note (Signed)
No Known Allergies:   Nursing/Ancillary Notes: **Vital Signs.:   11-Apr-14 01:16  Vital Signs Type Q 4hr  Temperature Temperature (F) 98.2  Celsius 36.7  Temperature Source oral  Pulse Pulse 104  Respirations Respirations 18  Systolic BP Systolic BP 145  Diastolic BP (mmHg) Diastolic BP (mmHg) 82  Mean BP 103  Pulse Ox % Pulse Ox % 91  Pulse Ox Activity Level  At rest  Oxygen Delivery Room Air/ 21 %    05:42  Vital Signs Type Q 4hr  Temperature Temperature (F) 99.4  Celsius 37.4  Temperature Source AdultAxillary  Pulse Pulse 106  Respirations Respirations 18  Systolic BP Systolic BP 135  Diastolic BP (mmHg) Diastolic BP (mmHg) 53  Mean BP 80  Pulse Ox % Pulse Ox % 93  Pulse Ox Activity Level  At rest  Oxygen Delivery Room Air/ 21 %    09:25  Vital Signs Type Q 4hr  Temperature Temperature (F) 97.8  Celsius 36.5  Pulse Pulse 112  Respirations Respirations 20  Systolic BP Systolic BP 122  Diastolic BP (mmHg) Diastolic BP (mmHg) 64  Mean BP 83  Pulse Ox % Pulse Ox % 93  Pulse Ox Activity Level  At rest  Oxygen Delivery Room Air/ 21 %    13:01  Vital Signs Type Q 4hr  Temperature Temperature (F) 99.5  Celsius 37.5  Temperature Source AdultAxillary  Pulse Pulse 112  Respirations Respirations 22  Systolic BP Systolic BP 126  Diastolic BP (mmHg) Diastolic BP (mmHg) 75  Mean BP 92  Pulse Ox % Pulse Ox % 92  Pulse Ox Activity Level  At rest  Oxygen Delivery Room Air/ 21 %   XRay:    10-Apr-14 14:52, Chest 1 View AP or PA  Chest 1 View AP or PA   REASON FOR EXAM:    shortness of breath  COMMENTS:   May transport without cardiac monitor    PROCEDURE: DXR - DXR CHEST 1 VIEWAP OR PA  - Feb 01 2013  2:52PM     RESULT: History: Shortness of breath.    Comparison Study: No prior chest x-ray.    Findings: Poor inspiration with atelectasis in both lung bases.   Cardiomegaly. No evidence of alveolar edema. Right shoulder replacement.    IMPRESSION:  Very poor  inspiration with atelectasis in the lung bases.   Superimposed pneumonia cannot be excluded.    Verified By: Gwynn BurlyHOMAS E. REGISTER, M.D., MD    Impression 80 y/o f with HTN a fib POD #3 choley for acute choleysitis now with shortness of breath  1. SOB: CXR shows ?? PNA, r/o CHF pt with low grade fever and elevated WBC on Unasyn lung exam with crackles and wheezing could be from pulm edema vs ATX 2. confusion hx dementia mild 3. hx a fib 4. hx HTN 5. POD #3 lap choley   Plan 1. obtain ECHO to evaluate for CHF 2. okay to continue lasix  3. stop IVF 4. CXR shows ATX vs PNA: would stop Unasyn and change to Zosyn  5.cont ISS 6. agree holding narcotics for confusion 7. O2 if needed currently not hypoxic   thank you wil lfollow   Electronic Signatures: Adrian SaranMody, Emaleigh Guimond (MD)  (Signed 11-Apr-14 14:14)  Authored: Allergies, Vital Signs, Radiology, Impression/Plan   Last Updated: 11-Apr-14 14:14 by Adrian SaranMody, Sharonda Llamas (MD)
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
## 2015-02-14 NOTE — Discharge Summary (Signed)
PATIENT NAME:  [name], [name] MR#:  [id number] DATE OF BIRTH:  02-22-1935  DATE OF ADMISSION:  01/30/2013 DATE OF DISCHARGE:  02/07/2013  Please see dictated discharge summary by Dr. Juliann PulseLundquist dated 02/04/2013 to encompass hospitalization from 01/30/2013 to 02/04/2013.    INTERVAL DISCHARGE SUMMARY: The patient had a questionable bile leak. HIDA scan was performed which was found to be essentially negative. The patient had improvement of her appetite. She had several bowel movements. She remained afebrile. She was seen by orthopedic surgery, Dr. Martha ClanKrasinski, for chronic right shoulder films. There was no evidence of fracture or dislocation seen. The patient was deemed suitable for discharge and [facility].   DISCHARGE INSTRUCTIONS: As per discharge summary dated 02/04/2013. She will follow up in our office in 7 to 10 days for possible drain removal.   ____________________________ Redge GainerMark A. Egbert GaribaldiBird, MD mab:cs D: 02/07/2013 15:00:00 ET T: 02/07/2013 15:15:33 ET JOB#: [id number]  cc: Loraine LericheMark A. Egbert GaribaldiBird, MD, <Dictator> Raynald KempMARK A Momen Ham MD ELECTRONICALLY SIGNED 02/16/2013 9:29

## 2015-02-14 NOTE — Consult Note (Signed)
CC: abd pain after surgery.  She is a little more oriented and appropriate today, her son thinks so also.  She has most of pain in right shoulder where she had surgery and now arthritis,  also some discomfort in gall bladder surgical sites.  Able to use incentive spirometer or me.  Drainage of 35cc in 24 hours in JP drain.  Felt to be a small amt and bears watching but no action needed at this time.  May get HIDA or CT before drain pulled.  Chest with decent air flow in ant fields, abd bowel sounds present.  VSS, afebrile, No new plans at this time.  Electronic Signatures: Scot JunElliott, Zahava Quant T (MD)  (Signed on 13-Apr-14 10:42)  Authored  Last Updated: 13-Apr-14 10:42 by Scot JunElliott, Mariyah Upshaw T (MD)
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
## 2015-02-14 NOTE — H&P (Signed)
PATIENT NAME:  [name] [name], [name] [name] MR#:  [id number] DATE OF BIRTH:  08/19/1935  DATE OF ADMISSION:  03/03/2013  PRIMARY CARE PROVIDER: Dr. Frazier Richards.   ED REFERRING DOCTOR: Dr. Jasmine December.  CHIEF COMPLAINT: Decrease in responsiveness.   HISTORY OF PRESENT ILLNESS: The patient is a 80 year old white female who was recently hospitalized for a diagnosis of acute cholecystitis, and underwent a cholecystectomy who was discharged back to the skilled nursing facility. Earlier today, the patient was having abdominal pain and nausea, so she received p.r.n. oxycodone and she also received Phenergan. This patient subsequently became unresponsive and EMS was called. When EMS arrived, they gave her Phenergan and she threw up a large bout of emesis.   The patient was brought to the ED. She continues to be very lethargic but does open her eyes a little bit when her name is called.   The patient had a chest x-ray done which shows bilateral infiltrates in the lungs,  raising a concern of possible aspiration pneumonia. We were asked to admit the patient.   Her daughter is at the bedside and she reports that her mom does have mild dementia, but has been doing okay and has not complained of any significant abdominal pain recently. She has not had any shortness of breath. She thinks that she may have been retaining some fluid. Otherwise review of systems unobtainable due to the patient's being very sleepy and lethargic. Also has dementia.   PAST MEDICAL HISTORY: 1.  History of chronic atrial fibrillation.  2.  Hypertension.  3.  Dementia.  4.  Hypothyroidism.  5.  Depression.  6.  Status post lap cholecystectomy. 7.  History of C-section.   ALLERGIES: No known allergies.  MEDICATIONS: She is on alprazolam 1 mg q.8 p.r.n. anxiety, Lasix 20 daily as needed for edema, magnesium plus suspension 30 mL q.4 p.r.n., oxycodone 100 mg per 5 mL, 5 mL q. daily as needed for pain, tramadol 50, 2 times a day as needed for  pain Ambien 5 at bedtime as needed for sleep, promethazine 25 mg IM q.4 p.r.n. nausea, aspirin 81 mg 1 tablet p.o. daily, bisacodyl  2 tablets p.r.n. constipation, citalopram 20 daily, diltiazem 30 mg 1 tablet p.o. b.i.d., Docusate at bedtime for a stool softener, Lasix 40 daily, gabapentin 100 mg 3 times a day, KCl 20 mEq  2 packets daily, levothyroxine 137 mcg daily, omeprazole 40, 1 tablet p.o. daily, Preparation H suppositories p.r.n.   SOCIAL HISTORY: No tobacco or alcohol use. Currently resides at a skilled nursing facility.   REVIEW OF SYSTEMS: Unobtainable due to the patient being very lethargic.   PHYSICAL EXAMINATION: VITAL SIGNS: Temperature 98.6, pulse 80, respirations 12, blood pressure 147/75, O2 96%.  GENERAL: The patient is an obese Caucasian female. Is very lethargic, but currently she opens her eyes slightly when calling her name.  HEENT: Pupils pin-point but reactive. Unable to do extraocular movements. External nasal exam shows no drainage or ulceration. Ears: No erythema and no drainage. Mouth: Her mouth appears a little dry, but no exudate.  NECK: Supple and symmetric. No masses. Thyroid midline. No JVD.  RESPIRATORY: She has rhonchi in both of her lungs. There are no rales. No accessory muscle usage.  CARDIOVASCULAR: Irregularly irregular rhythm. No murmurs, gallops, clicks, or rubs.  GASTROINTESTINAL: No tenderness. No mass. No hepatosplenomegaly. No hernia. Positive bowel sounds x 4.   GENITOURINARY: Deferred.  MUSCULOSKELETAL: There is no erythema or swelling.  SKIN: There is no rash. No lesions.  LYMPHATICS:  No lymph nodes palpable.  VASCULAR: Good DP, PT pulses.  EXTREMITIES: She has 1 + edema.  NEUROLOGIC: The patient is very sleepy and lethargic. Unable to do a thorough neurologic exam. Cranial nerves II-XII grossly appear intact.   PSYCHIATRIC: Unable to do due to the patient being sleepy and lethargic.   EVALUATION: BMP: Glucose 112. BNP is 2337, BUN 16,  creatinine 1.05, sodium 136, potassium 4.5, chloride is 102, CO2 29, calcium is 9.7.   LFTs: Total protein 8.4, albumin 3.4, alk phos 281, AST is 120.   is positive for opioids. Troponin less than 0.02. UAs shows leukocytes, 2+.   Chest x-ray shows bilateral pulmonary infiltrates.   ASSESSMENT AND PLAN: The patient is a 80 year old white female, nursing home resident who was brought in with a decrease in responsiveness.   1.  Acute encephalopathy, likely due to medications: At this time we will follow her mental status.  2. Possible acute congestive heart failure, type unknown: The patient had an echocardiogram  during her recent hospitalization but it was not interpreted; possible aspiration. At this time we will treat her with IV Lasix, low-dose, and IV Levaquin, follow her renal function and follow her ins and outs. If her creatinine increases, stop the Lasix tomorrow.  3.  Chronic atrial fibrillation. We will continue to monitor her on telemetry. Continue aspirin.  4.  Will continue her low-dose Cardizem.  5.  Hypothyroidism: We will continue Synthroid.  6. Depression, anxiety: Hold Xanax and Celexa, and Neurontin for tonight in light of her decrease in responsiveness.  7.  Miscellaneous: We will place her on heparin for DVT prophylaxis.    Note: Forty-five minutes spent was on this H and P.   Please note, the patient is a patient of Dr. Tonette Bihari, so our services are transferred to [facility].      ____________________________ Lafonda Mosses. Posey Pronto, MD shp:dm D: 03/03/2013 22:48:33 ET T: 03/04/2013 12:01:35 ET JOB#: [id number]  cc: Bradrick Kamau H. Posey Pronto, MD, <Dictator> Alric Seton MD ELECTRONICALLY SIGNED 03/09/2013 14:35

## 2015-02-14 NOTE — Op Note (Signed)
PATIENT NAME:  [name], [name] MR#:  [id number] DATE OF BIRTH:  1934/12/18  DATE OF PROCEDURE:  01/30/2013  ATTENDING PHYSICIAN: Cristal Deerhristopher A. Gerrad Welker, MD  REASON FOR SURGERY: Recurrent right upper quadrant pain, leukocytosis and CT scan and ultrasound concerning for cholecystitis.   PREOPERATIVE DIAGNOSIS: Acute cholecystitis.  POSTOPERATIVE DIAGNOSIS: Acute cholecystitis.  PROCEDURE PERFORMED: Laparoscopic cholecystectomy.   ANESTHESIA: General.   ESTIMATED BLOOD LOSS: 300 mL.   SPECIMEN: Gallbladder.   COMPLICATIONS: None.   INDICATION FOR SURGERY: The patient is a pleasant 80 year old female who presented with recurrent right upper quadrant pain and a CT scan concerning for cholecystitis. She was brought to the operating room suite for cholecystectomy.   DETAILS OF PROCEDURE: The patient was brought to the operating room suite. She was laid supine on the operating room table. Her abdomen was prepped and draped in standard surgical fashion. A timeout was then performed correctly identifying the patient name, operative site and procedure to be performed. An infraumbilical incision was made. This deepened down to the fascia. The fascia was incised. A sterile finger entered the peritoneum. There were no obvious adhesions. Two 0 Vicryl stay sutures were placed through the peritoneum. A soft trocar was placed into the abdomen. The abdomen was insufflated. The gallbladder was visualized. It appeared to be lateral. I then placed an 11 mm port in the epigastric region and two 5 mm ports in the midclavicular and right anterior axillary line. The gallbladder was noted to be distended and appeared to be consistent with cholecystitis. An aspirator was placed on the gallbladder to remove fluid to allow the gallbladder to be grasped. The gallbladder was then brought up over the dome of the liver. The cystic duct and cystic artery were dissected out. They were clipped. The gallbladder was then taken off  the gallbladder fossa. There was a large amount of fluid associated with that. The gallbladder was then taken out through the supraumbilical port through an Endo Catch bag. The packed fossa was then irrigated and was attempted to be hemostatic. In the end, I did use cautery and 4 pieces of Surgicel and it appeared to be hemostatic. I did place a JP drain in the upper quadrant port site and sutured that in place. The trocars were then removed under direct visualization. A 0 Vicryl was used to close the infraumbilical fascia and the skin was closed using an interrupted 4-0 Monocryl. Steri-Strips, Telfa gauze and Tegaderm were then used to complete the dressing. The patient was then awoken, extubated and brought to the postanesthesia care unit. There were no immediate complications. Needle, sponge and instrument counts were correct at the end of the procedure.   ____________________________ [name] Raiderhristopher A. Otie Headlee, MD cal:jm D: 01/30/2013 19:31:54 ET T: 01/30/2013 22:07:07 ET JOB#: [id number]  cc: Cristal Deerhristopher A. Hadlei Stitt, MD, <Dictator> [name] NewcomerHRISTOPHER A Mattix Imhof MD ELECTRONICALLY SIGNED 01/31/2013 16:22

## 2015-02-14 NOTE — Discharge Summary (Signed)
PATIENT NAME:  [name] [name], [name] [name] MR#:  [id number] DATE OF BIRTH:  19-Jul-1935  DATE OF ADMISSION:  03/03/2013 DATE OF DISCHARGE:  03/05/2013  Note: Date of planned discharge is 03/05/2013.  DISCHARGE DIAGNOSES: 1.  Encephalopathy, likely secondary to opioids.  2.  Aspiration pneumonia, evidence on chest x-ray, post nausea and vomiting from above.  3.  Dementia, severe.  4.  Recent cholecystectomy and cholecystitis, seemingly resolved.   DISCHARGE MEDICATIONS: Per Med Reconciliation in the [facility] Record.   HISTORY AND PHYSICAL: Please see detailed history and physical done on admission.   HOSPITAL COURSE: The patient was admitted after a large dose of oxycodone and Phenergan, with nausea, vomiting, possible aspiration on the chest x-ray. She was given Zofran. She was given IV fluids. She was observed. Her mental status improved. She is awake, alert, cooperative this morning, although she is mostly nonverbal, as is her baseline with her severe dementia.   She is known not to tolerate oxycodone and other strong opioids well, so those again will be held off. She will have p.r.n. tramadol, which she tolerates better, p.r.n. low-dose alprazolam. Her white blood cell count was minimally elevated on admission at 11,100. It got up to 14,000 before coming back to normal at 8000 presently. She has a mild anemia, likely secondary to age, multiple factors, recent surgery, etc.   She will be discharged back to the skilled facility to finish up approximately a week of Augmentin in case she did have aspiration pneumonia, given that her film was an expiratory film; it was difficult to read for certain.   Please note, it took approximately 34 minutes to do all discharge tasks today.    ____________________________ Marya AmslerMarshall W. Dareen PianoAnderson, MD mwa:dm D: 03/05/2013 07:10:53 ET T: 03/05/2013 09:48:58 ET JOB#: [id number]  cc: Marya AmslerMarshall W. Dareen PianoAnderson, MD, <Dictator> Lauro RegulusMARSHALL W Panagiota Perfetti  MD ELECTRONICALLY SIGNED 03/05/2013 17:29
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
## 2015-02-14 NOTE — Consult Note (Signed)
CC: post op bile drainage into JP drain.  Pt TB remains normal.  Amt of drainage is small.  Agree with HIDA tomorrow, consider CT if clinical signs of leak also.  I will sign off.  Reconsult Dr. Bluford Kaufmannh or Dr. Servando SnareWohl if need ERCP.  Electronic Signatures: Scot JunElliott, Robert T (MD)  (Signed on 14-Apr-14 14:40)  Authored  Last Updated: 14-Apr-14 14:40 by Scot JunElliott, Robert T (MD)
